# Patient Record
Sex: Male | Born: 1985 | Race: White | Hispanic: Yes | Marital: Married
Health system: Southern US, Community
[De-identification: ages and names within clinical notes are randomized; demographics above are authoritative.]

---

## 2015-07-05 ENCOUNTER — Other Ambulatory Visit: Payer: Self-pay | Admitting: Orthopedic Surgery

## 2015-07-05 DIAGNOSIS — M5416 Radiculopathy, lumbar region: Secondary | ICD-10-CM

## 2015-07-05 DIAGNOSIS — M5442 Lumbago with sciatica, left side: Secondary | ICD-10-CM

## 2015-07-08 ENCOUNTER — Ambulatory Visit
Admission: RE | Admit: 2015-07-08 | Discharge: 2015-07-08 | Disposition: A | Discharge status: Home / Self Care | Payer: BC Managed Care – PPO | Source: Ambulatory Visit | Attending: Orthopedic Surgery | Admitting: Orthopedic Surgery

## 2015-07-08 DIAGNOSIS — M5416 Radiculopathy, lumbar region: Secondary | ICD-10-CM

## 2015-07-08 DIAGNOSIS — M5442 Lumbago with sciatica, left side: Secondary | ICD-10-CM

## 2015-07-08 DIAGNOSIS — M5417 Radiculopathy, lumbosacral region: Secondary | ICD-10-CM | POA: Diagnosis not present

## 2015-07-08 DIAGNOSIS — M549 Dorsalgia, unspecified: Secondary | ICD-10-CM | POA: Diagnosis present

## 2017-07-26 IMAGING — MR MR LUMBAR SPINE W/O CM
4 of 5 series · 25 of 48 positions shown · non-contrast
Comparison: None.

CLINICAL DATA: Low back with LEFT buttock pain and LEFT calf pain
for 1 month.

EXAM:
MRI LUMBAR SPINE WITHOUT CONTRAST
TECHNIQUE: Multiplanar, multisequence MR imaging of the lumbar spine was
performed. No intravenous contrast was administered.

[Series 4: T2 · sagittal · 4.0mm · 0.81mm/px · 5 of 15 slices shown (1 of 2)]
[im 1/15]
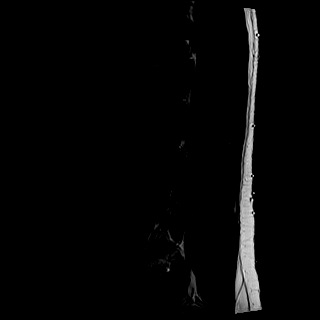
[im 4/15]
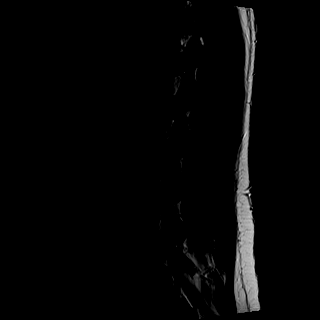
[im 8/15]
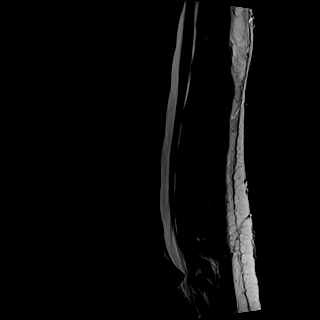
[im 11/15]
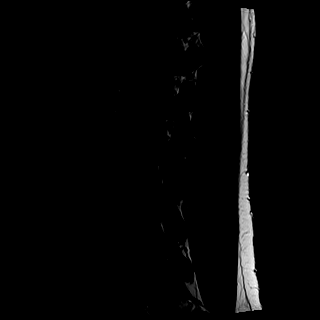
[im 15/15]
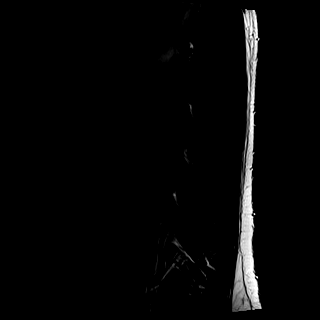

[Series 5: T1 · sagittal · 4.0mm · 0.41mm/px · 5 of 15 slices shown (1 of 2)]
[im 1/15]
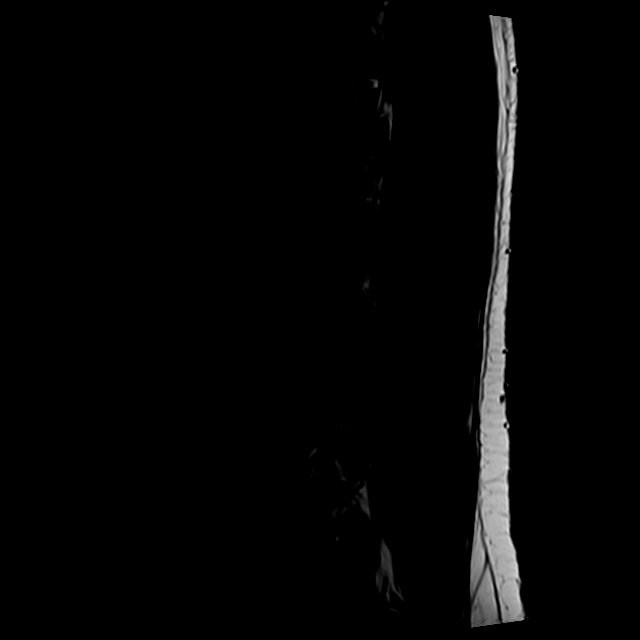
[im 4/15]
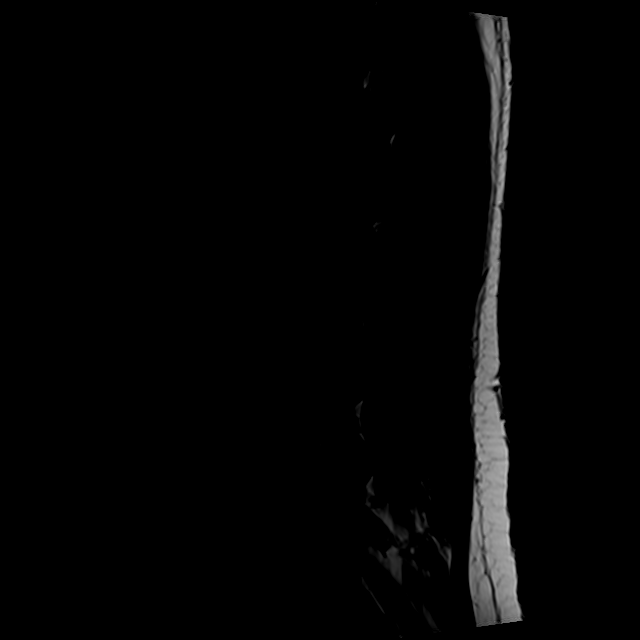
[im 8/15]
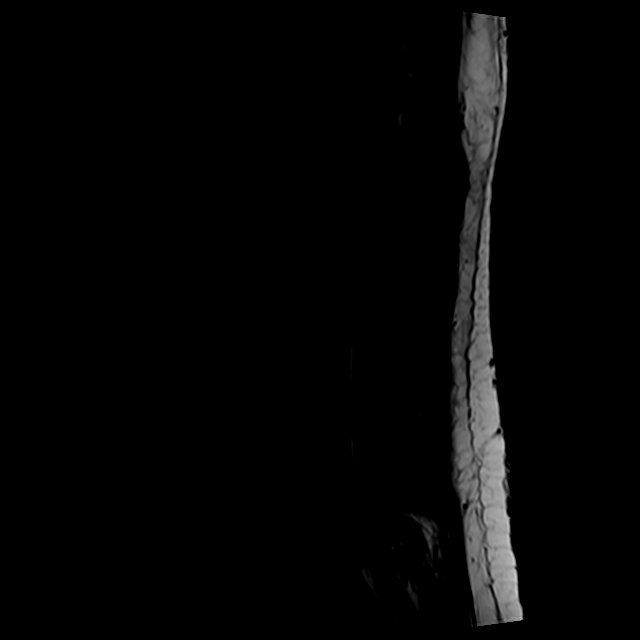
[im 11/15]
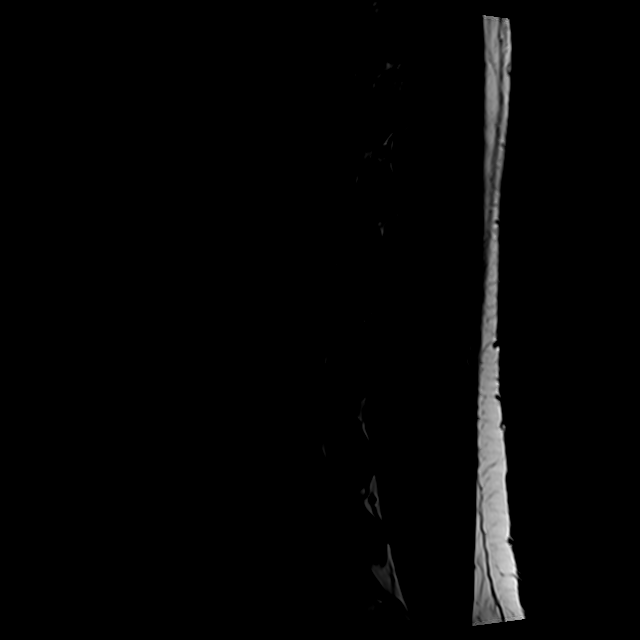
[im 15/15]
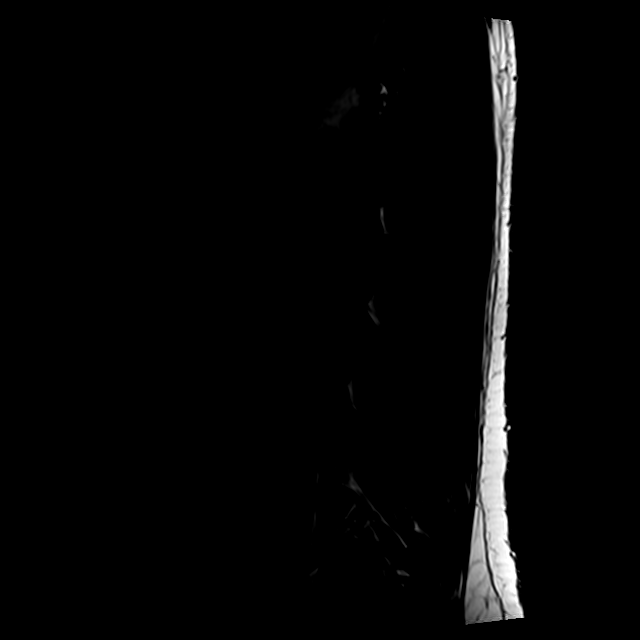

[Series 7: T2 · axial · 4.0mm · 0.78mm/px · z∈[-115,+111]mm · 10 of 42 slices shown (2 of 2)]
[im 3/42]
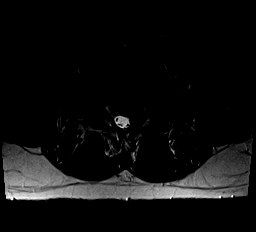
[im 6/42]
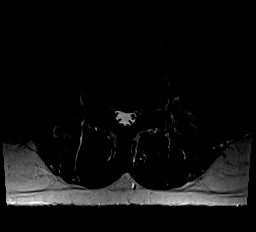
[im 9/42]
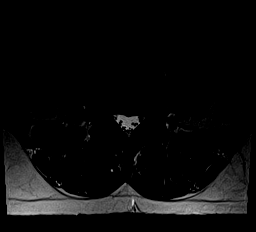
[im 14/42]
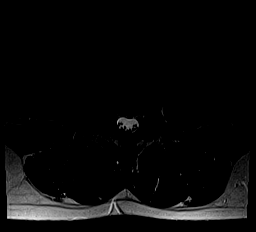
[im 20/42]
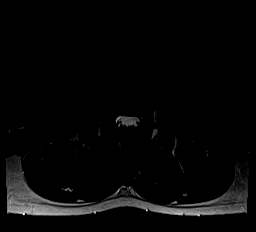
[im 22/42]
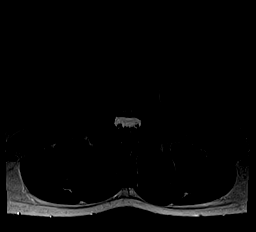
[im 25/42]
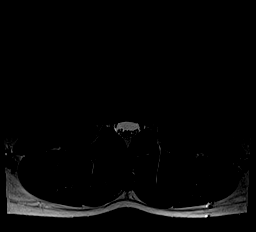
[im 31/42]
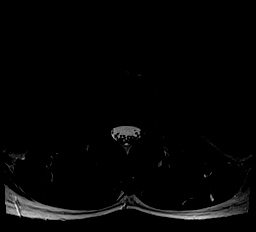
[im 36/42]
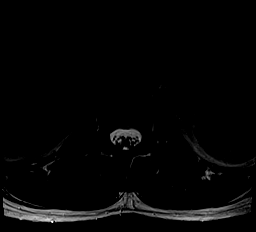
[im 42/42]
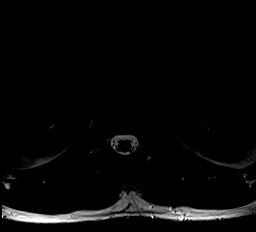

[Series 8: T1 · axial · 4.0mm · 0.31mm/px · z∈[-115,+81]mm · 5 of 42 slices shown (2 of 2)]
[im 3/42]
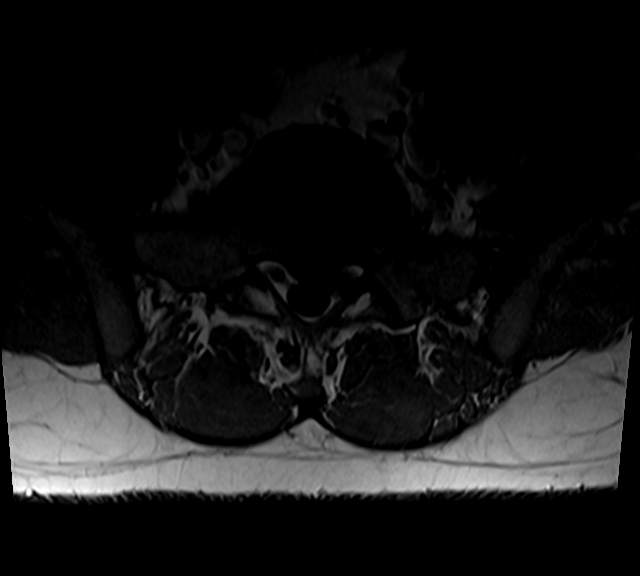
[im 6/42]
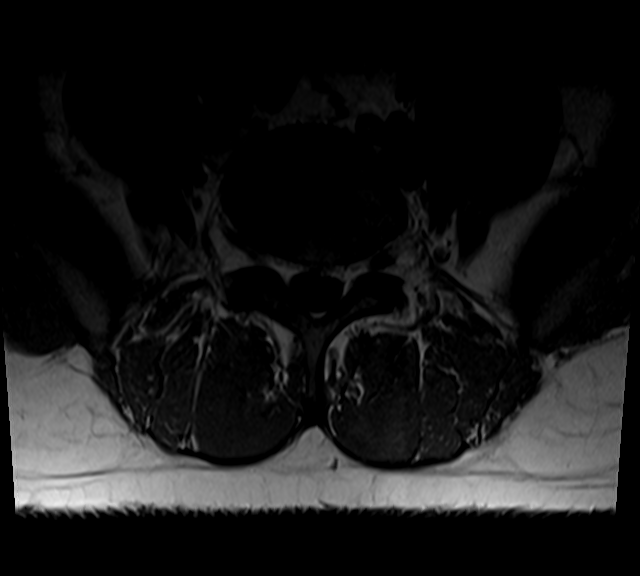
[im 9/42]
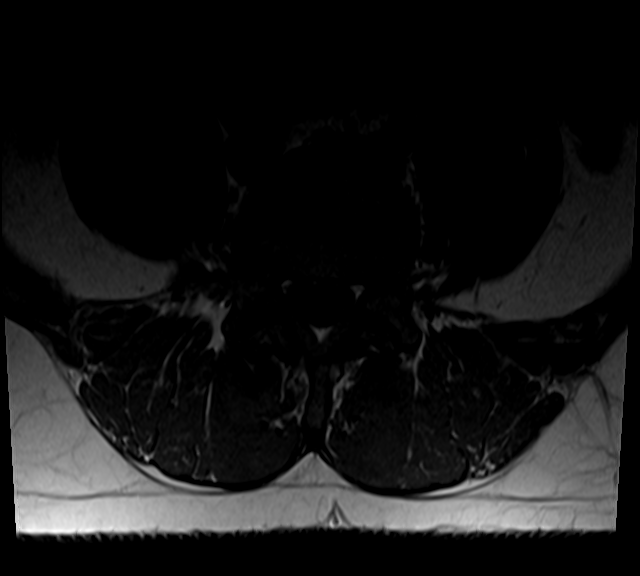
[im 22/42]
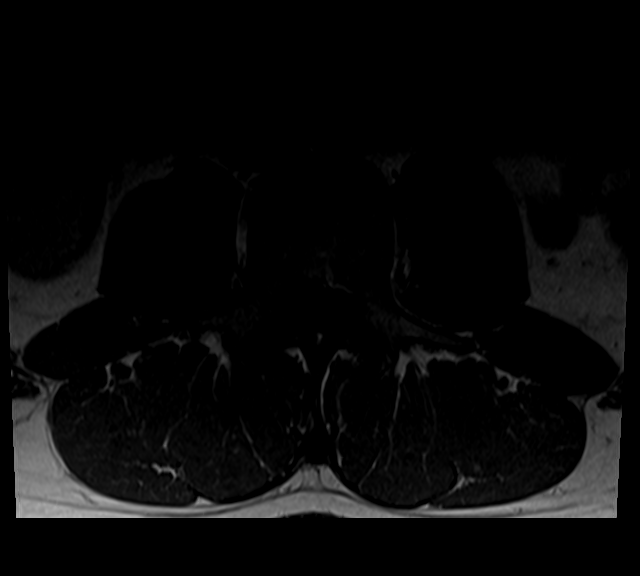
[im 36/42]
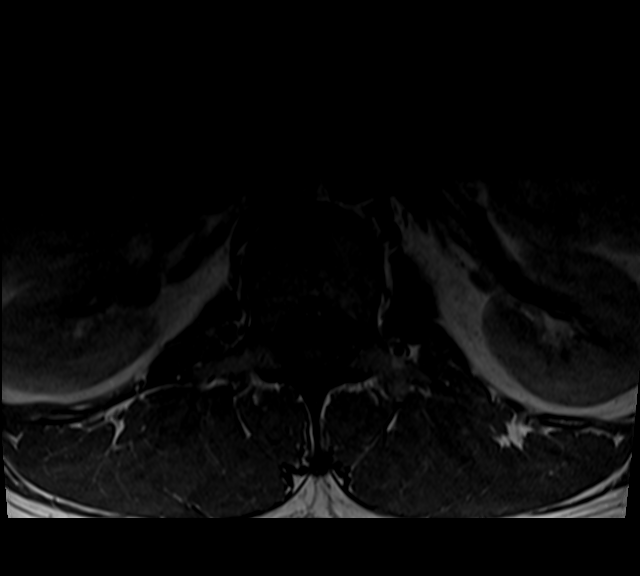

[25 of 48 positions shown; findings below may reference images not displayed]

FINDINGS: Segmentation: Normal.

Alignment:  Normal.

Vertebrae: No worrisome osseous lesion.

Conus medullaris: Normal in size, signal, and location.

Paraspinal tissues: No evidence for hydronephrosis or paravertebral
mass.

Disc levels:

L1-L2:  Normal.

L2-L3:  Normal.

L3-L4:  Normal.

L4-L5:  Normal.

L5-S1: Central and leftward disc extrusion. Mild to moderate central
canal stenosis. Severe LEFT subarticular zone narrowing compresses
the LEFT S1 nerve root. Slight LEFT-sided foraminal narrowing does
not clearly affect the L5 nerve root. Mild facet arthropathy is
superimposed.
IMPRESSION: The dominant abnormality is a central and leftward disc extrusion at
L5-S1. Significant LEFT S1 nerve root impingement is observed.

## 2018-01-25 ENCOUNTER — Ambulatory Visit: Payer: BC Managed Care – PPO | Attending: Neurology

## 2018-01-25 DIAGNOSIS — R0683 Snoring: Secondary | ICD-10-CM | POA: Insufficient documentation

## 2018-01-25 DIAGNOSIS — G4730 Sleep apnea, unspecified: Secondary | ICD-10-CM | POA: Insufficient documentation

## 2020-02-07 ENCOUNTER — Ambulatory Visit: Payer: BC Managed Care – PPO | Attending: Internal Medicine

## 2020-02-07 DIAGNOSIS — Z23 Encounter for immunization: Secondary | ICD-10-CM

## 2020-02-07 NOTE — Progress Notes (Signed)
   [facility]  Name:  Fong Marcos.    MRN: [id number] DOB: 02/24/1986  02/07/2020  Mr. Dobbins was observed post Covid-19 immunization for 15 minutes without incidence. He was provided with Vaccine Information Sheet and instruction to access the V-Safe system.   Mr. Shadowens was instructed to call 911 with any severe reactions post vaccine: Marland Kitchen Difficulty breathing  . Swelling of your face and throat  . A fast heartbeat  . A bad rash all over your body  . Dizziness and weakness    Immunizations Administered    Name Date Dose VIS Date Route   Moderna COVID-19 Vaccine 02/07/2020  4:20 PM 0.5 mL 11/11/2019 Intramuscular   Manufacturer: Moderna   Lot: [id number]   [place]:[id number]

## 2020-03-06 ENCOUNTER — Ambulatory Visit: Payer: BC Managed Care – PPO | Attending: Internal Medicine

## 2020-03-06 DIAGNOSIS — Z23 Encounter for immunization: Secondary | ICD-10-CM

## 2020-03-06 NOTE — Progress Notes (Signed)
   [facility]  Name:  Gabriel Holmes.    MRN: [id number] DOB: May 13, 1986  03/06/2020  [name] was observed post Covid-19 immunization for 15 minutes without incident. He was provided with Vaccine Information Sheet and instruction to access the V-Safe system.   [name] was instructed to call 911 with any severe reactions post vaccine: Marland Kitchen Difficulty breathing  . Swelling of face and throat  . A fast heartbeat  . A bad rash all over body  . Dizziness and weakness   Immunizations Administered    Name Date Dose VIS Date Route   Moderna COVID-19 Vaccine 03/06/2020  1:21 PM 0.5 mL 11/11/2019 Intramuscular   Manufacturer: Levan Hurst   LotUD:[id number]   [place]:[id number]

## 2022-05-31 ENCOUNTER — Ambulatory Visit: Payer: BC Managed Care – PPO | Admitting: Dermatology

## 2022-05-31 DIAGNOSIS — D2371 Other benign neoplasm of skin of right lower limb, including hip: Secondary | ICD-10-CM | POA: Diagnosis not present

## 2022-05-31 DIAGNOSIS — D2271 Melanocytic nevi of right lower limb, including hip: Secondary | ICD-10-CM

## 2022-05-31 DIAGNOSIS — D229 Melanocytic nevi, unspecified: Secondary | ICD-10-CM

## 2022-05-31 DIAGNOSIS — L578 Other skin changes due to chronic exposure to nonionizing radiation: Secondary | ICD-10-CM

## 2022-05-31 DIAGNOSIS — Z808 Family history of malignant neoplasm of other organs or systems: Secondary | ICD-10-CM

## 2022-05-31 DIAGNOSIS — D2272 Melanocytic nevi of left lower limb, including hip: Secondary | ICD-10-CM | POA: Diagnosis not present

## 2022-05-31 DIAGNOSIS — Z1283 Encounter for screening for malignant neoplasm of skin: Secondary | ICD-10-CM | POA: Diagnosis not present

## 2022-05-31 DIAGNOSIS — D1800 Hemangioma unspecified site: Secondary | ICD-10-CM

## 2022-05-31 DIAGNOSIS — Q825 Congenital non-neoplastic nevus: Secondary | ICD-10-CM

## 2022-05-31 DIAGNOSIS — L814 Other melanin hyperpigmentation: Secondary | ICD-10-CM

## 2022-05-31 NOTE — Progress Notes (Unsigned)
   New Patient Visit  Subjective  Gabriel Holmes. is a 36 y.o. male who presents for the following: Total body skin exam (Check mole on foot not sure which foot, Father with hx of skin cancer) and bumps  (Back, no treatment).  New patient referral from Dr. [place] Pink.  The following portions of the chart were reviewed this encounter and updated as appropriate:       Review of Systems:  No other skin or systemic complaints except as noted in HPI or Assessment and Plan.  Objective  Well appearing patient in no apparent distress; mood and affect are within normal limits.  A full examination was performed including scalp, head, eyes, ears, nose, lips, neck, chest, axillae, abdomen, back, buttocks, bilateral upper extremities, bilateral lower extremities, hands, feet, fingers, toes, fingernails, and toenails. All findings within normal limits unless otherwise noted below.  R lat base of neck  R lat base of neck Brown flat pap 0.8 x 0.6cm     L mid sole; R post lat sole  R post lat sole 1.0 x 0.3cm brown macule  L mid sole light brown macule 0.2cm         Assessment & Plan   Lentigines - Scattered tan macules - Due to sun exposure - Benign-appearing, observe - Recommend daily broad spectrum sunscreen SPF 30+ to sun-exposed areas, reapply every 2 hours as needed. - Call for any change - ears  Melanocytic Nevi - Tan-brown and/or pink-flesh-colored symmetric macules and papules - Benign appearing on exam today - Observation - Call clinic for new or changing moles - Recommend daily use of broad spectrum spf 30+ sunscreen to sun-exposed areas.  - scalp, arms, back, buttocks  Hemangiomas - Red papules - Discussed benign nature - Observe - Call for any changes - scalp, chest  Actinic Damage - Chronic condition, secondary to cumulative UV/sun exposure - diffuse scaly erythematous macules with underlying dyspigmentation - Recommend daily broad spectrum  sunscreen SPF 30+ to sun-exposed areas, reapply every 2 hours as needed.  - Staying in the shade or wearing long sleeves, sun glasses (UVA+UVB protection) and wide brim hats (4-inch brim around the entire circumference of the hat) are also recommended for sun protection.  - Call for new or changing lesions.  Skin cancer screening performed today.  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  Observation.  Call clinic for new or changing moles.  Recommend daily use of broad spectrum spf 30+ sunscreen to sun-exposed areas.    Nevus (2) L mid sole; R post lat sole  Benign-appearing.  Observation.  Call clinic for new or changing moles.  Recommend daily use of broad spectrum spf 30+ sunscreen to sun-exposed areas.     Return for 1-2 yrs for TBSE, recheck moles on feet.  I, Othelia Pulling, RMA, am acting as scribe for Sarina Ser, MD .

## 2022-05-31 NOTE — Patient Instructions (Addendum)
     Melanoma ABCDEs  Melanoma is the most dangerous type of skin cancer, and is the leading cause of death from skin disease.  You are more likely to develop melanoma if you: Have light-colored skin, light-colored eyes, or red or blond hair Spend a lot of time in the sun Tan regularly, either outdoors or in a tanning bed Have had blistering sunburns, especially during childhood Have a close family member who has had a melanoma Have atypical moles or large birthmarks  Early detection of melanoma is key since treatment is typically straightforward and cure rates are extremely high if we catch it early.   The first sign of melanoma is often a change in a mole or a new dark spot.  The ABCDE system is a way of remembering the signs of melanoma.  A for asymmetry:  The two halves do not match. B for border:  The edges of the growth are irregular. C for color:  A mixture of colors are present instead of an even brown color. D for diameter:  Melanomas are usually (but not always) greater than 6mm - the size of a pencil eraser. E for evolution:  The spot keeps changing in size, shape, and color.  Please check your skin once per month between visits. You can use a small mirror in front and a large mirror behind you to keep an eye on the back side or your body.   If you see any new or changing lesions before your next follow-up, please call to schedule a visit.  Please continue daily skin protection including broad spectrum sunscreen SPF 30+ to sun-exposed areas, reapplying every 2 hours as needed when you're outdoors.   Staying in the shade or wearing long sleeves, sun glasses (UVA+UVB protection) and wide brim hats (4-inch brim around the entire circumference of the hat) are also recommended for sun protection.    Due to recent changes in healthcare laws, you may see results of your pathology and/or laboratory studies on MyChart before the doctors have had a chance to review them. We  understand that in some cases there may be results that are confusing or concerning to you. Please understand that not all results are received at the same time and often the doctors may need to interpret multiple results in order to provide you with the best plan of care or course of treatment. Therefore, we ask that you please give us 2 business days to thoroughly review all your results before contacting the office for clarification. Should we see a critical lab result, you will be contacted sooner.   If You Need Anything After Your Visit  If you have any questions or concerns for your doctor, please call our main line at [phone] and press option 4 to reach your doctor's medical assistant. If no one answers, please leave a voicemail as directed and we will return your call as soon as possible. Messages left after 4 pm will be answered the following business day.   You may also send us a message via MyChart. We typically respond to MyChart messages within 1-2 business days.  For prescription refills, please ask your pharmacy to contact our office. Our fax number is [phone].  If you have an urgent issue when the clinic is closed that cannot wait until the next business day, you can page your doctor at the number below.    Please note that while we do our best to be available for urgent issues   outside of office hours, we are not available 24/7.   If you have an urgent issue and are unable to reach us, you may choose to seek medical care at your doctor's office, retail clinic, urgent care center, or emergency room.  If you have a medical emergency, please immediately call 911 or go to the emergency department.  Pager Numbers  - Dr. Kowalski: [phone]  - Dr. Moye: [phone]  - Dr. Stewart: [phone]  In the event of inclement weather, please call our main line at [phone] for an update on the status of any delays or closures.  Dermatology Medication Tips: Please  keep the boxes that topical medications come in in order to help keep track of the instructions about where and how to use these. Pharmacies typically print the medication instructions only on the boxes and not directly on the medication tubes.   If your medication is too expensive, please contact our office at [phone] option 4 or send us a message through MyChart.   We are unable to tell what your co-pay for medications will be in advance as this is different depending on your insurance coverage. However, we may be able to find a substitute medication at lower cost or fill out paperwork to get insurance to cover a needed medication.   If a prior authorization is required to get your medication covered by your insurance company, please allow us 1-2 business days to complete this process.  Drug prices often vary depending on where the prescription is filled and some pharmacies may offer cheaper prices.  The website www.goodrx.com contains coupons for medications through different pharmacies. The prices here do not account for what the cost may be with help from insurance (it may be cheaper with your insurance), but the website can give you the price if you did not use any insurance.  - You can print the associated coupon and take it with your prescription to the pharmacy.  - You may also stop by our office during regular business hours and pick up a GoodRx coupon card.  - If you need your prescription sent electronically to a different pharmacy, notify our office through [facility] MyChart or by phone at [phone] option 4.     Si Usted Necesita Algo Despus de Su Visita  Tambin puede enviarnos un mensaje a travs de MyChart. Por lo general respondemos a los mensajes de MyChart en el transcurso de 1 a 2 das hbiles.  Para renovar recetas, por favor pida a su farmacia que se ponga en contacto con nuestra oficina. Nuestro nmero de fax es el [phone].  Si tiene un asunto urgente  cuando la clnica est cerrada y que no puede esperar hasta el siguiente da hbil, puede llamar/localizar a su doctor(a) al nmero que aparece a continuacin.   Por favor, tenga en cuenta que aunque hacemos todo lo posible para estar disponibles para asuntos urgentes fuera del horario de oficina, no estamos disponibles las 24 horas del da, los 7 das de la semana.   Si tiene un problema urgente y no puede comunicarse con nosotros, puede optar por buscar atencin mdica  en el consultorio de su doctor(a), en una clnica privada, en un centro de atencin urgente o en una sala de emergencias.  Si tiene una emergencia mdica, por favor llame inmediatamente al 911 o vaya a la sala de emergencias.  Nmeros de bper  - Dr. Kowalski: [phone]  - Dra. Moye: [phone]  - Dra. Stewart: [phone]  En caso   de inclemencias del tiempo, por favor llame a nuestra lnea principal al [phone] para una actualizacin sobre el estado de cualquier retraso o cierre.  Consejos para la medicacin en dermatologa: Por favor, guarde las cajas en las que vienen los medicamentos de uso tpico para ayudarle a seguir las instrucciones sobre dnde y cmo usarlos. Las farmacias generalmente imprimen las instrucciones del medicamento slo en las cajas y no directamente en los tubos del medicamento.   Si su medicamento es muy caro, por favor, pngase en contacto con nuestra oficina llamando al [phone] y presione la opcin 4 o envenos un mensaje a travs de MyChart.   No podemos decirle cul ser su copago por los medicamentos por adelantado ya que esto es diferente dependiendo de la cobertura de su seguro. Sin embargo, es posible que podamos encontrar un medicamento sustituto a menor costo o llenar un formulario para que el seguro cubra el medicamento que se considera necesario.   Si se requiere una autorizacin previa para que su compaa de seguros cubra su medicamento, por favor permtanos de 1 a 2  das hbiles para completar este proceso.  Los precios de los medicamentos varan con frecuencia dependiendo del lugar de dnde se surte la receta y alguna farmacias pueden ofrecer precios ms baratos.  El sitio web www.goodrx.com tiene cupones para medicamentos de diferentes farmacias. Los precios aqu no tienen en cuenta lo que podra costar con la ayuda del seguro (puede ser ms barato con su seguro), pero el sitio web puede darle el precio si no utiliz ningn seguro.  - Puede imprimir el cupn correspondiente y llevarlo con su receta a la farmacia.  - Tambin puede pasar por nuestra oficina durante el horario de atencin regular y recoger una tarjeta de cupones de GoodRx.  - Si necesita que su receta se enve electrnicamente a una farmacia diferente, informe a nuestra oficina a travs de MyChart de [facility] o por telfono llamando al [phone] y presione la opcin 4.  

## 2022-06-02 ENCOUNTER — Encounter: Payer: Self-pay | Admitting: Dermatology

## 2023-06-06 ENCOUNTER — Ambulatory Visit: Payer: BC Managed Care – PPO | Admitting: Dermatology
